# Patient Record
Sex: Male | Born: 1995 | Race: Black or African American | Hispanic: No | Marital: Single
Health system: Southern US, Community
[De-identification: ages and names within clinical notes are randomized; demographics above are authoritative.]

---
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
## 2016-11-10 DIAGNOSIS — F909 Attention-deficit hyperactivity disorder, unspecified type: Secondary | ICD-10-CM | POA: Diagnosis not present

## 2016-12-02 ENCOUNTER — Other Ambulatory Visit: Payer: Self-pay | Admitting: Internal Medicine

## 2016-12-02 DIAGNOSIS — R945 Abnormal results of liver function studies: Secondary | ICD-10-CM

## 2016-12-09 DIAGNOSIS — R748 Abnormal levels of other serum enzymes: Secondary | ICD-10-CM | POA: Diagnosis not present

## 2016-12-09 DIAGNOSIS — R945 Abnormal results of liver function studies: Secondary | ICD-10-CM | POA: Diagnosis not present

## 2016-12-13 ENCOUNTER — Ambulatory Visit
Admission: RE | Admit: 2016-12-13 | Discharge: 2016-12-13 | Disposition: A | Discharge status: Home / Self Care | Payer: BLUE CROSS/BLUE SHIELD | Source: Ambulatory Visit | Attending: Internal Medicine | Admitting: Internal Medicine

## 2016-12-13 DIAGNOSIS — R945 Abnormal results of liver function studies: Secondary | ICD-10-CM

## 2017-07-31 DIAGNOSIS — F900 Attention-deficit hyperactivity disorder, predominantly inattentive type: Secondary | ICD-10-CM | POA: Diagnosis not present

## 2017-07-31 DIAGNOSIS — R748 Abnormal levels of other serum enzymes: Secondary | ICD-10-CM | POA: Diagnosis not present

## 2017-07-31 DIAGNOSIS — Z6832 Body mass index (BMI) 32.0-32.9, adult: Secondary | ICD-10-CM | POA: Diagnosis not present

## 2017-10-02 DIAGNOSIS — Z7689 Persons encountering health services in other specified circumstances: Secondary | ICD-10-CM | POA: Diagnosis not present

## 2018-12-06 DIAGNOSIS — F900 Attention-deficit hyperactivity disorder, predominantly inattentive type: Secondary | ICD-10-CM | POA: Diagnosis not present

## 2019-08-01 DIAGNOSIS — H40033 Anatomical narrow angle, bilateral: Secondary | ICD-10-CM | POA: Diagnosis not present

## 2019-08-01 DIAGNOSIS — F909 Attention-deficit hyperactivity disorder, unspecified type: Secondary | ICD-10-CM | POA: Diagnosis not present

## 2019-08-01 DIAGNOSIS — H04123 Dry eye syndrome of bilateral lacrimal glands: Secondary | ICD-10-CM | POA: Diagnosis not present

## 2019-10-10 ENCOUNTER — Ambulatory Visit: Payer: Self-pay | Attending: Family

## 2019-10-10 ENCOUNTER — Ambulatory Visit: Payer: Self-pay

## 2019-10-10 DIAGNOSIS — Z23 Encounter for immunization: Secondary | ICD-10-CM

## 2019-10-10 NOTE — Progress Notes (Signed)
   [facility]  Name:  Marc Ramirez    MRN: [id number] DOB: 12-07-1995  10/10/2019  Mr. Wenker was observed post Covid-19 immunization for 15 minutes without incident. He was provided with Vaccine Information Sheet and instruction to access the V-Safe system.   Mr. Kinnan was instructed to call 911 with any severe reactions post vaccine: Marland Kitchen Difficulty breathing  . Swelling of face and throat  . A fast heartbeat  . A bad rash all over body  . Dizziness and weakness   Immunizations Administered    Name Date Dose VIS Date Route   Moderna COVID-19 Vaccine 10/10/2019 11:32 AM 0.5 mL 07/02/2019 Intramuscular   Manufacturer: Moderna   Lot: [id number]   NDC: 69450-388-82

## 2019-11-12 ENCOUNTER — Ambulatory Visit: Payer: Self-pay | Attending: Family

## 2019-11-12 DIAGNOSIS — Z23 Encounter for immunization: Secondary | ICD-10-CM

## 2019-11-12 NOTE — Progress Notes (Signed)
   [facility]ov[facility] Vaccination [facility]  Name:  Marc Ramirez    MRN: [id number] DOB: [birth year]  11/12/2019  Marc Ramirez was observed post Covid-19 immunization for 15 minutes without incident. He was provided with Vaccine Information Sheet and instruction to access the V-Safe system.   Marc Ramirez was instructed to call 911 with any severe reactions post vaccine: Marland Kitchen Difficulty breathing  . Swelling of face and throat  . A fast heartbeat  . A bad rash all over body  . Dizziness and weakness   Immunizations Administered    Name Date Dose VIS Date Route   Moderna COVID-19 Vaccine 11/12/2019  2:46 PM 0.5 mL 07/02/2019 Intramuscular   Manufacturer: Moderna   Lot: [id number]   NDC: 58307-460-02

## 2019-12-03 DIAGNOSIS — Z13220 Encounter for screening for lipoid disorders: Secondary | ICD-10-CM | POA: Diagnosis not present

## 2019-12-03 DIAGNOSIS — Z0000 Encounter for general adult medical examination without abnormal findings: Secondary | ICD-10-CM | POA: Diagnosis not present

## 2019-12-06 DIAGNOSIS — Z0000 Encounter for general adult medical examination without abnormal findings: Secondary | ICD-10-CM | POA: Diagnosis not present

## 2019-12-06 DIAGNOSIS — Z23 Encounter for immunization: Secondary | ICD-10-CM | POA: Diagnosis not present

## 2020-01-05 DIAGNOSIS — W268XXA Contact with other sharp object(s), not elsewhere classified, initial encounter: Secondary | ICD-10-CM | POA: Diagnosis not present

## 2020-01-05 DIAGNOSIS — S0180XA Unspecified open wound of other part of head, initial encounter: Secondary | ICD-10-CM | POA: Diagnosis not present

## 2020-04-05 DIAGNOSIS — S335XXA Sprain of ligaments of lumbar spine, initial encounter: Secondary | ICD-10-CM | POA: Diagnosis not present

## 2020-04-05 DIAGNOSIS — M545 Low back pain: Secondary | ICD-10-CM | POA: Diagnosis not present
